# Patient Record
Sex: Male | Born: 1995 | Race: White | Hispanic: No | Marital: Single | Smoking: Current every day smoker
Health system: Southern US, Community
[De-identification: ages and names within clinical notes are randomized; demographics above are authoritative.]

## PROBLEM LIST (undated history)

## (undated) DIAGNOSIS — A749 Chlamydial infection, unspecified: Secondary | ICD-10-CM

## (undated) DIAGNOSIS — F909 Attention-deficit hyperactivity disorder, unspecified type: Secondary | ICD-10-CM

---
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
## 2014-03-29 ENCOUNTER — Encounter (HOSPITAL_COMMUNITY): Payer: Self-pay | Admitting: Emergency Medicine

## 2014-03-29 ENCOUNTER — Emergency Department (HOSPITAL_COMMUNITY)
Admission: EM | Admit: 2014-03-29 | Discharge: 2014-03-30 | Disposition: A | Discharge status: Home / Self Care | Payer: MEDICAID | Attending: Emergency Medicine | Admitting: Emergency Medicine

## 2014-03-29 DIAGNOSIS — F1410 Cocaine abuse, uncomplicated: Secondary | ICD-10-CM | POA: Diagnosis not present

## 2014-03-29 DIAGNOSIS — Z720 Tobacco use: Secondary | ICD-10-CM | POA: Diagnosis not present

## 2014-03-29 DIAGNOSIS — F329 Major depressive disorder, single episode, unspecified: Secondary | ICD-10-CM | POA: Insufficient documentation

## 2014-03-29 DIAGNOSIS — F1310 Sedative, hypnotic or anxiolytic abuse, uncomplicated: Secondary | ICD-10-CM | POA: Insufficient documentation

## 2014-03-29 DIAGNOSIS — Z046 Encounter for general psychiatric examination, requested by authority: Secondary | ICD-10-CM | POA: Diagnosis present

## 2014-03-29 DIAGNOSIS — F32A Depression, unspecified: Secondary | ICD-10-CM

## 2014-03-29 DIAGNOSIS — F1910 Other psychoactive substance abuse, uncomplicated: Secondary | ICD-10-CM | POA: Diagnosis not present

## 2014-03-29 DIAGNOSIS — Z639 Problem related to primary support group, unspecified: Secondary | ICD-10-CM | POA: Diagnosis not present

## 2014-03-29 LAB — CBC WITH DIFFERENTIAL/PLATELET
Basophils Absolute: 0.1 10*3/uL (ref 0.0–0.1)
Basophils Relative: 1 % (ref 0–1)
Eosinophils Absolute: 0.5 10*3/uL (ref 0.0–0.7)
Eosinophils Relative: 7 % — ABNORMAL HIGH (ref 0–5)
HCT: 47.8 % (ref 39.0–52.0)
Hemoglobin: 16.5 g/dL (ref 13.0–17.0)
Lymphocytes Relative: 19 % (ref 12–46)
Lymphs Abs: 1.2 10*3/uL (ref 0.7–4.0)
MCH: 31.7 pg (ref 26.0–34.0)
MCHC: 34.5 g/dL (ref 30.0–36.0)
MCV: 91.9 fL (ref 78.0–100.0)
Monocytes Absolute: 0.6 10*3/uL (ref 0.1–1.0)
Monocytes Relative: 9 % (ref 3–12)
Neutro Abs: 4.2 10*3/uL (ref 1.7–7.7)
Neutrophils Relative %: 64 % (ref 43–77)
Platelets: 183 10*3/uL (ref 150–400)
RBC: 5.2 MIL/uL (ref 4.22–5.81)
RDW: 12.9 % (ref 11.5–15.5)
WBC: 6.5 10*3/uL (ref 4.0–10.5)

## 2014-03-29 LAB — RAPID URINE DRUG SCREEN, HOSP PERFORMED
Amphetamines: NOT DETECTED
Barbiturates: POSITIVE — AB
Benzodiazepines: NOT DETECTED
Cocaine: POSITIVE — AB
Opiates: NOT DETECTED
Tetrahydrocannabinol: NOT DETECTED

## 2014-03-29 LAB — BASIC METABOLIC PANEL
Anion gap: 8 (ref 5–15)
BUN: 9 mg/dL (ref 6–23)
CO2: 26 mmol/L (ref 19–32)
Calcium: 8.9 mg/dL (ref 8.4–10.5)
Chloride: 102 mmol/L (ref 96–112)
Creatinine, Ser: 0.87 mg/dL (ref 0.50–1.35)
GFR calc Af Amer: >90 mL/min (ref >90)
GFR calc non Af Amer: >90 mL/min (ref >90)
Glucose, Bld: 109 mg/dL — ABNORMAL HIGH (ref 70–99)
Potassium: 3.8 mmol/L (ref 3.5–5.1)
Sodium: 136 mmol/L (ref 135–145)

## 2014-03-29 LAB — ETHANOL: Alcohol, Ethyl (B): <5 mg/dL (ref 0–9)
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
## 2014-03-29 NOTE — ED Notes (Signed)
Meal tray and drink provided at patient request along with warm blanket. Sitter at bedside. No other needs voiced.
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
## 2014-03-30 DIAGNOSIS — Z639 Problem related to primary support group, unspecified: Secondary | ICD-10-CM

## 2014-03-30 DIAGNOSIS — F1910 Other psychoactive substance abuse, uncomplicated: Secondary | ICD-10-CM

## 2014-03-30 DIAGNOSIS — F1410 Cocaine abuse, uncomplicated: Secondary | ICD-10-CM | POA: Insufficient documentation

## 2014-03-30 DIAGNOSIS — Z638 Other specified problems related to primary support group: Secondary | ICD-10-CM | POA: Insufficient documentation

## 2014-03-30 NOTE — ED Provider Notes (Signed)
He has been evaluated by TTS, who feel that he is stable for discharge and to follow-up with his usual psychiatric caregivers. The patient's involuntary commitment, has not been upheld.  On evaluation, patient admits to substance abuse, but denies suicidal ideation. He does not have any homicidal ideation. He is ready to go home and agrees to follow-up at [facility].  Andrew BaleElliott Emillie Chasen, MD 03/30/14 1434

## 2014-03-30 NOTE — ED Notes (Signed)
Officer here to transport pt home, took original ivc paperwork

## 2014-03-30 NOTE — ED Notes (Signed)
Pt denies SI/HI, states he only did cocaine one time and trying to get off all drugs, states he is doing better

## 2014-03-30 NOTE — ED Notes (Signed)
Reassess Telepsy complete

## 2014-03-30 NOTE — Discharge Instructions (Signed)
[facility] this [facility], as scheduled. Avoid using all illegal drugs.

## 2014-03-30 NOTE — ED Notes (Signed)
Patient given discharge instruction, verbalized understand. Patient ambulatory out of the department.  

## 2014-03-30 NOTE — ED Notes (Addendum)
Per Licensed conveyancerunit secretary. IVC paperwork rescinded and police called to pick up pt.nad noted.

## 2014-03-30 NOTE — Progress Notes (Signed)
CSW spoke with pt's grandfather via phone to gather collateral information at NP's request. Andrew LoronGrandfather states [facility] is [facility], he is not sure exactly what services he receives but understands it is SA related. States pt has no known hx of self-harm or suicide attempts, and he denies that patient has ever exhibited violent behavior directed toward individuals. He does state patient  "seems to have major anger problems" and will "punch walls and tear up furniture." States he is unsure of patient's hx of of current SA use.States he would like pt to be court ordered to attend outpatient therapy and psychiatrist appointments to address "his mood swings."   Reports he does not feel pt is in danger of harm to himself or others at this time and does not feel unsafe with patient in the home.  Andrew SkillMeghan Devarius Nelles, MSW, LCSWA Clinical Social Work, Disposition Office 03/30/2014

## 2014-03-30 NOTE — ED Notes (Signed)
resended paper work, pt Journalist, newspaperwant officer to take him home
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
## 2016-03-13 ENCOUNTER — Emergency Department (HOSPITAL_COMMUNITY): Payer: Medicaid Other

## 2016-03-13 ENCOUNTER — Inpatient Hospital Stay (HOSPITAL_COMMUNITY)
Admission: EM | Admit: 2016-03-13 | Discharge: 2016-03-15 | DRG: 896 | Disposition: A | Discharge status: Home / Self Care | Payer: Medicaid Other | Attending: Family Medicine | Admitting: Family Medicine

## 2016-03-13 ENCOUNTER — Encounter (HOSPITAL_COMMUNITY): Payer: Self-pay

## 2016-03-13 DIAGNOSIS — J9601 Acute respiratory failure with hypoxia: Secondary | ICD-10-CM | POA: Diagnosis present

## 2016-03-13 DIAGNOSIS — F10129 Alcohol abuse with intoxication, unspecified: Principal | ICD-10-CM | POA: Diagnosis present

## 2016-03-13 DIAGNOSIS — R4182 Altered mental status, unspecified: Secondary | ICD-10-CM | POA: Diagnosis present

## 2016-03-13 DIAGNOSIS — Y908 Blood alcohol level of 240 mg/100 ml or more: Secondary | ICD-10-CM | POA: Diagnosis present

## 2016-03-13 DIAGNOSIS — Z978 Presence of other specified devices: Secondary | ICD-10-CM

## 2016-03-13 DIAGNOSIS — T50904A Poisoning by unspecified drugs, medicaments and biological substances, undetermined, initial encounter: Secondary | ICD-10-CM

## 2016-03-13 DIAGNOSIS — R40243 Glasgow coma scale score 3-8, unspecified time: Secondary | ICD-10-CM

## 2016-03-13 DIAGNOSIS — R402432 Glasgow coma scale score 3-8, at arrival to emergency department: Secondary | ICD-10-CM

## 2016-03-13 DIAGNOSIS — F10929 Alcohol use, unspecified with intoxication, unspecified: Secondary | ICD-10-CM

## 2016-03-13 DIAGNOSIS — F17210 Nicotine dependence, cigarettes, uncomplicated: Secondary | ICD-10-CM | POA: Diagnosis present

## 2016-03-13 DIAGNOSIS — T50901A Poisoning by unspecified drugs, medicaments and biological substances, accidental (unintentional), initial encounter: Secondary | ICD-10-CM

## 2016-03-13 LAB — COMPREHENSIVE METABOLIC PANEL
ALT: 15 U/L — ABNORMAL LOW (ref 17–63)
AST: 22 U/L (ref 15–41)
Albumin: 4.3 g/dL (ref 3.5–5.0)
Alkaline Phosphatase: 78 U/L (ref 38–126)
Anion gap: 9 (ref 5–15)
BUN: 8 mg/dL (ref 6–20)
CO2: 27 mmol/L (ref 22–32)
Calcium: 8.7 mg/dL — ABNORMAL LOW (ref 8.9–10.3)
Chloride: 108 mmol/L (ref 101–111)
Creatinine, Ser: 0.88 mg/dL (ref 0.61–1.24)
GFR calc Af Amer: >60 mL/min (ref >60)
GFR calc non Af Amer: >60 mL/min (ref >60)
Glucose, Bld: 104 mg/dL — ABNORMAL HIGH (ref 65–99)
Potassium: 4 mmol/L (ref 3.5–5.1)
Sodium: 144 mmol/L (ref 135–145)
Total Bilirubin: 0.4 mg/dL (ref 0.3–1.2)
Total Protein: 7.1 g/dL (ref 6.5–8.1)

## 2016-03-13 LAB — CBC WITH DIFFERENTIAL/PLATELET
Basophils Absolute: 0.1 10*3/uL (ref 0.0–0.1)
Basophils Relative: 1 %
Eosinophils Absolute: 0.2 10*3/uL (ref 0.0–0.7)
Eosinophils Relative: 2 %
HCT: 45.4 % (ref 39.0–52.0)
Hemoglobin: 16 g/dL (ref 13.0–17.0)
Lymphocytes Relative: 24 %
Lymphs Abs: 2.8 10*3/uL (ref 0.7–4.0)
MCH: 32.7 pg (ref 26.0–34.0)
MCHC: 35.2 g/dL (ref 30.0–36.0)
MCV: 92.8 fL (ref 78.0–100.0)
Monocytes Absolute: 1.1 10*3/uL — ABNORMAL HIGH (ref 0.1–1.0)
Monocytes Relative: 9 %
Neutro Abs: 7.2 10*3/uL (ref 1.7–7.7)
Neutrophils Relative %: 64 %
Platelets: 242 10*3/uL (ref 150–400)
RBC: 4.89 MIL/uL (ref 4.22–5.81)
RDW: 12.7 % (ref 11.5–15.5)
WBC: 11.3 10*3/uL — ABNORMAL HIGH (ref 4.0–10.5)

## 2016-03-13 LAB — BLOOD GAS, ARTERIAL
Acid-Base Excess: 0.9 mmol/L (ref 0.0–2.0)
Bicarbonate: 23.3 mmol/L (ref 20.0–28.0)
Drawn by: 317771
FIO2: 0.4
MECHVT: 520 mL
O2 Content: 40 L/min
O2 Saturation: 98.9 %
PEEP: 5 cmH2O
RATE: 15 resp/min
pCO2 arterial: 44.8 mmHg (ref 32.0–48.0)
pH, Arterial: 7.349 — ABNORMAL LOW (ref 7.350–7.450)
pO2, Arterial: 203 mmHg — ABNORMAL HIGH (ref 83.0–108.0)

## 2016-03-13 LAB — RAPID URINE DRUG SCREEN, HOSP PERFORMED
Amphetamines: NOT DETECTED
Barbiturates: NOT DETECTED
Benzodiazepines: NOT DETECTED
Cocaine: NOT DETECTED
Opiates: NOT DETECTED
Tetrahydrocannabinol: NOT DETECTED

## 2016-03-13 LAB — URINALYSIS, ROUTINE W REFLEX MICROSCOPIC
Bilirubin Urine: NEGATIVE
Glucose, UA: NEGATIVE mg/dL
Hgb urine dipstick: NEGATIVE
Ketones, ur: NEGATIVE mg/dL
Leukocytes, UA: NEGATIVE
Nitrite: NEGATIVE
Protein, ur: NEGATIVE mg/dL
Specific Gravity, Urine: 1.001 — ABNORMAL LOW (ref 1.005–1.030)
pH: 5 (ref 5.0–8.0)

## 2016-03-13 LAB — ETHANOL: Alcohol, Ethyl (B): 392 mg/dL (ref <5)

## 2016-03-13 MED ORDER — PROPOFOL 1000 MG/100ML IV EMUL
5.0000 ug/kg/min | Freq: Once | INTRAVENOUS | Status: AC
  Administered 2016-03-13: 5 ug/kg/min via INTRAVENOUS

## 2016-03-13 MED ORDER — NALOXONE HCL 2 MG/2ML IJ SOSY
PREFILLED_SYRINGE | INTRAMUSCULAR | Status: AC
  Filled 2016-03-13: qty 2

## 2016-03-13 MED ORDER — PROPOFOL 1000 MG/100ML IV EMUL
INTRAVENOUS | Status: AC
  Filled 2016-03-13: qty 100

## 2016-03-13 MED ORDER — NALOXONE HCL 2 MG/2ML IJ SOSY
2.0000 mg | PREFILLED_SYRINGE | Freq: Once | INTRAMUSCULAR | Status: AC
  Administered 2016-03-13: 2 mg via INTRAVENOUS

## 2016-03-13 MED ORDER — ONDANSETRON HCL 4 MG/2ML IJ SOLN
4.0000 mg | Freq: Once | INTRAMUSCULAR | Status: AC
  Administered 2016-03-13: 4 mg via INTRAVENOUS
  Filled 2016-03-13: qty 2

## 2016-03-13 MED ORDER — ROCURONIUM BROMIDE 50 MG/5ML IV SOLN
50.0000 mg | Freq: Once | INTRAVENOUS | Status: AC
  Administered 2016-03-13: 50 mg via INTRAVENOUS

## 2016-03-13 NOTE — ED Notes (Signed)
Emergency Contact: AuntColvin Caroli: Charity Cooper [phone]

## 2016-03-13 NOTE — ED Provider Notes (Signed)
[facility]-EMERGENCY DEPT Provider Note   CSN: [id number] Arrival date & time: 03/13/16  2130 By signing my name below, I, Andrew Watkins, attest that this documentation has been prepared under the direction and in the presence of Eber Hong, MD . Electronically Signed: Levon Watkins, Scribe. 03/13/2016. 9:37 PM.   History   Chief Complaint Chief Complaint  Patient presents with  . Drug Overdose   LEVEL 5 CAVEAT-- PT UNRESPONSIVE   HPI REFORD Andrew Watkins is a 21 y.o. male brought in by ambulance who presents to the Emergency Department with a chief complaint of drug overdose. Pt was dropped off in a coma at a friend's house by other friends who were drinking with him. No other history available other than a family member who stated he has a hx of abusing opiates and other drugs. Paramedics gave 4 mg of narcan with no improvement. CBG was 110. Vital signs with slowed respirations, low 90's oxygen, assisted respirations en route.  The history is provided by the EMS personnel. The history is limited by the condition of the patient. No language interpreter was used.   Past Medical History:  Diagnosis Date  . ADHD (attention deficit hyperactivity disorder)     Patient Active Problem List   Diagnosis Date Noted  . Family conflict   . Cocaine abuse     History reviewed. No pertinent surgical history.   Home Medications    Prior to Admission medications   Medication Sig Start Date End Date Taking? Authorizing Provider  diphenhydrAMINE (BENADRYL) 25 mg capsule Take 25 mg by mouth every 6 (six) hours as needed for allergies.    Historical Provider, MD    Family History No family history on file.  Social History Social History  Substance Use Topics  . Smoking status: Current Every Day Smoker    Types: Cigarettes  . Smokeless tobacco: Never Used  . Alcohol use Yes     Comment: occasional     Allergies   Patient has no known allergies.   Review of Systems Review of Systems    Unable to perform ROS: Patient unresponsive   Physical Exam Updated Vital Signs BP 111/81   Pulse 99   Resp 14   Ht 5\' 10"  (1.778 m)   Wt 130 lb (59 kg)   SpO2 100%   BMI 18.65 kg/m   Physical Exam  Constitutional: He appears well-developed and well-nourished. He appears distressed.  HENT:  Head: Normocephalic and atraumatic.  Mouth/Throat: Oropharynx is clear and moist. No oropharyngeal exudate.  Multiple distasteful drawings on the patient's face including words and pictures of male genitalia  Eyes: Pupils are equal, round, and reactive to light. Right eye exhibits no discharge. Left eye exhibits no discharge. No scleral icterus.  Pupils are enlarged, symmetrical and reactive, mild conjunctival injection bilaterally, no drainage or discharge, no swelling of the lids  Neck: Normal range of motion. Neck supple. No JVD present. No thyromegaly present.  Cardiovascular: Regular rhythm, normal heart sounds and intact distal pulses.  Exam reveals no gallop and no friction rub.   No murmur heard. Mild tachycardia, strong pulses  Pulmonary/Chest: Breath sounds normal. He is in respiratory distress. He has no wheezes. He has no rales.  Slowed respirations, inadequate respiratory effort, decreased lung sounds bilaterally  Abdominal: Soft. Bowel sounds are normal. He exhibits no distension and no mass. There is no tenderness.  Musculoskeletal: Normal range of motion. He exhibits no edema or tenderness.  Lymphadenopathy:    He has no  cervical adenopathy.  Neurological:  The patient is obtunded, he does not respond to anything but deep painful stimuli which gives him a grimace but he does not move arms or legs during this time  Skin: Skin is warm and dry. No rash noted. No erythema.  No signs of injury to the skin with lacerations abrasions hematomas or contusions. There is multiple black marker drawings allover the patient's skin on his arms and his face  Psychiatric: He has a normal mood  and affect. His behavior is normal.  Nursing note and vitals reviewed.  ED Treatments / Results  DIAGNOSTIC STUDIES:  Oxygen Saturation is 90% on RA, normal by my interpretation.     Labs (all labs ordered are listed, but only abnormal results are displayed) Labs Reviewed  ETHANOL - Abnormal; Notable for the following:       Result Value   Alcohol, Ethyl (B) 392 (*)    All other components within normal limits  COMPREHENSIVE METABOLIC PANEL - Abnormal; Notable for the following:    Glucose, Bld 104 (*)    Calcium 8.7 (*)    ALT 15 (*)    All other components within normal limits  CBC WITH DIFFERENTIAL/PLATELET - Abnormal; Notable for the following:    WBC 11.3 (*)    Monocytes Absolute 1.1 (*)    All other components within normal limits  URINALYSIS, ROUTINE W REFLEX MICROSCOPIC - Abnormal; Notable for the following:    Color, Urine COLORLESS (*)    Specific Gravity, Urine 1.001 (*)    All other components within normal limits  RAPID URINE DRUG SCREEN, HOSP PERFORMED  BLOOD GAS, ARTERIAL    EKG  EKG Interpretation  Date/Time:  Friday March 13 2016 21:50:30 EST Ventricular Rate:  72 PR Interval:    QRS Duration: 113 QT Interval:  386 QTC Calculation: 423 R Axis:   89 Text Interpretation:  Sinus rhythm Inferior infarct, acute (LCx) ST elevation, consider anterior injury Lateral leads are also involved diffuse ST elevattions - likely early repolarization Confirmed by Jalila Goodnough  MD, Orvil Faraone ([id number]) on 03/13/2016 10:16:22 PM       Radiology Dg Chest Port 1 View  Result Date: 03/13/2016 CLINICAL DATA:  Status post intubation for drug overdose. Unresponsive. EXAM: PORTABLE CHEST 1 VIEW COMPARISON:  None. FINDINGS: The heart size and mediastinal contours are within normal limits. Tip of endotracheal tube is 3.9 cm above the carina in satisfactory position. Gastric tube is seen coiled in the expected location of the stomach. Both lungs are clear. The visualized skeletal structures  are unremarkable. IMPRESSION: Satisfactory support line and tube positions.  Clear lungs. Electronically Signed   By: Tollie Eth M.D.   On: 03/13/2016 22:10    Procedures OG placement Date/Time: 03/13/2016 9:49 PM Performed by: Eber Hong Authorized by: Eber Hong  Consent: The procedure was performed in an emergent situation. Required items: required blood products, implants, devices, and special equipment available Patient identity confirmed: arm band Time out: Immediately prior to procedure a "time out" was called to verify the correct patient, procedure, equipment, support staff and site/side marked as required. Preparation: Patient was prepped and draped in the usual sterile fashion. Local anesthesia used: no  Anesthesia: Local anesthesia used: no Patient tolerance: Patient tolerated the procedure well with no immediate complications Comments: OG placed - imaging ordered to confirm placement    (including critical care time)  Medications Ordered in ED Medications  naloxone [facility]) injection 2 mg (2 mg Intravenous Given 03/13/16 2130)  rocuronium [facility](ZEMURON) injection 50 mg (50 mg Intravenous Given 03/13/16 2133)  ondansetron [facility](ZOFRAN) injection 4 mg (4 mg Intravenous Given 03/13/16 2205)  propofol (DIPRIVAN) 1000 MG/100ML infusion (20 mcg/kg/min  59 kg Intravenous Transfusing/Transfer 03/13/16 2256)     Initial Impression / Assessment and Plan / ED Course  I have reviewed the triage vital signs and the nursing notes.  Pertinent labs & imaging results that were available during my care of the patient were reviewed by me and considered in my medical decision making (see chart for details).  INTUBATION Performed by: Vida RollerBrian D Asyria Kolander  Required items: required blood products, implants, devices, and special equipment available Patient identity confirmed: provided demographic data and hospital-assigned identification number Time out: Immediately prior to procedure a "time out" was  called to verify the correct patient, procedure, equipment, support staff and site/side marked as required.  Indications: GCS 4, decreased respiratory effor  Intubation method: Direct Laryngoscopy   Preoxygenation: BVM  Sedatives: No sedation needed - obtunded Paralytic: 50mg  Rocuronium  Tube Size: 8--0 cuffed  Post-procedure assessment: chest rise and ETCO2 monitor Breath sounds: equal and absent over the epigastrium Tube secured with: ETT holder Chest x-ray interpreted by radiologist and me.  Chest x-ray findings: endotracheal tube in appropriate position  Patient tolerated the procedure well with no immediate complications.  the patient is clearly critically ill, he does not have the capacity to make decisions about intubation as he is unable to even open his eyes, he does not speak, his GCS is extremely low and he is barely breathing enough to maintain her oxygen level that is adequate for organ perfusion. At this time the decision was made to both intubated the patient as well as placed an orogastric tube. We will evaluate for the source of the severe obtunded state, he is essentially in a coma. He is critically ill. He will be placed on a ventilator. Alcohol, drug, labs ordered, chest x-ray to confirm placement of tubes. Propofol for sedation.  CRITICAL CARE Performed by: Vida RollerBrian D Lealon Vanputten Total critical care time: 35 minutes Critical care time was exclusive of separately billable procedures and treating other patients. Critical care was necessary to treat or prevent imminent or life-threatening deterioration. Critical care was time spent personally by me on the following activities: development of treatment plan with patient and/or surrogate as well as nursing, discussions with consultants, evaluation of patient's response to treatment, examination of patient, obtaining history from patient or surrogate, ordering and performing treatments and interventions, ordering and review of  laboratory studies, ordering and review of radiographic studies, pulse oximetry and re-evaluation of patient's condition.   D/W Dr. Sharl MaLama - will admit to ICU  Final Clinical Impressions(s) / ED Diagnoses   Final diagnoses:  Glasgow coma scale total score 3-8, at arrival to emergency department [facility](HCC)  Drug overdose, undetermined intent, initial encounter  Alcoholic intoxication with complication [facility](HCC)    New Prescriptions New Prescriptions   No medications on file   I personally performed the services described in this documentation, which was scribed in my presence. The recorded information has been reviewed and is accurate.        Eber HongBrian Bayli Quesinberry, MD 03/13/16 [phone]

## 2016-03-13 NOTE — ED Triage Notes (Signed)
Pt was dropped off at a friends house unresponsive, ems arrived and pt was given 4 mg narcan without response.  Unknown what pt may have taken, but friends state pt has been drinking.

## 2016-03-13 NOTE — ED Notes (Signed)
CRITICAL VALUE ALERT  Critical value received:  etoh 392  Date of notification:  03/13/16  Time of notification:  2230  Critical value read back: yes  Nurse who received alert:  Wynona Dovehris Corsica Franson rn  MD notified (1st page):  miller  Time of first page:  2230  MD notified (2nd page):  Time of second page:  Responding MD:  [name]  Time MD responded:  2230

## 2016-03-13 NOTE — H&P (Signed)
[facility] H&P    Patient Demographics:    Andrew Watkins, is a 21 y.o. male  MRN: [id number]  DOB - 08/01/1995  Admit Date - 03/13/2016  Referring MD/NP/PA: Dr. Hyacinth Meeker  Outpatient Primary MD for the patient is No PCP Per Patient   Chief Complaint  Patient presents with  . Drug Overdose      HPI:    Andrew Watkins  is a 21 y.o. male, Was brought to the ED by ambulance with complaints of drug overdose. Patient was dropped off in a coma at a friend's house by other friends were drinking with him. Patient instantly got a 1 day pass to get out of rehabilitation, where he has been admitted for detox. Incidentally patient and his friends were drinking and then the drop them off in a coma at the friend's house. EMS was called patient was given 4 mg Narcan with no improvement. Has patient had very shallow respirations O2 sats in low 90s, he was intubated in the ED.  No other history is obtainable. Alcohol level was 392    Review of systems:    Unobtainable as patient is intubated   With Past History of the following :    Past Medical History:  Diagnosis Date  . ADHD (attention deficit hyperactivity disorder)       History reviewed. No pertinent surgical history.    Social History:      Social History  Substance Use Topics  . Smoking status: Current Every Day Smoker    Types: Cigarettes  . Smokeless tobacco: Never Used  . Alcohol use Yes     Comment: occasional       Family History :   Family history is unobtainable as patient is intubated   Home Medications:   Prior to Admission medications   Medication Sig Start Date End Date Taking? Authorizing Provider  diphenhydrAMINE (BENADRYL) 25 mg capsule Take 25 mg by mouth every 6 (six) hours as needed for allergies.    Historical Provider, MD     Allergies:    No Known Allergies   Physical Exam:   Vitals  Blood pressure 111/81, pulse 99,  resp. rate 14, height 5\' 10"  (1.778 m), weight 59 kg (130 lb), SpO2 100 %.  1.  General: Intubated  2. Psychiatric: Not tested  3. Neurologic: Patient is currently on Diprivan, neurological examination not performed  4. Eyes :  anicteric sclerae, moist conjunctivae   5. ENMT:  Endotracheal tube in place  6. Neck:  supple, no cervical lymphadenopathy appriciated, No thyromegaly  7. Respiratory : Normal respiratory effort, good air movement bilaterally,clear to  auscultation bilaterally  8. Cardiovascular : RRR, no gallops, rubs or murmurs, no leg edema  9. Gastrointestinal:  Positive bowel sounds, abdomen soft, non-tender to palpation,no hepatosplenomegaly, no rigidity or guarding       10. Skin:  No cyanosis, normal texture and turgor, no rash, lesions or ulcers     Data Review:    CBC  Recent Labs Lab 03/13/16 2143  WBC 11.3*  HGB 16.0  HCT 45.4  PLT 242  MCV 92.8  MCH 32.7  MCHC 35.2  RDW 12.7  LYMPHSABS 2.8  MONOABS 1.1*  EOSABS 0.2  BASOSABS 0.1   ------------------------------------------------------------------------------------------------------------------  Chemistries   Recent Labs Lab 03/13/16 2143  NA 144  K 4.0  CL 108  CO2 27  GLUCOSE 104*  BUN 8  CREATININE 0.88  CALCIUM 8.7*  AST 22  ALT 15*  ALKPHOS 78  BILITOT 0.4   ------------------------------------------------------------------------------------------------------------------  ------------------------------------------------------------------------------------------------------------------ GFR: Estimated Creatinine Clearance: 110.8 mL/min (by C-G formula based on SCr of 0.88 mg/dL). Liver Function Tests:  Recent Labs Lab 03/13/16 2143  AST 22  ALT 15*  ALKPHOS 78  BILITOT 0.4  PROT 7.1  ALBUMIN 4.3    --------------------------------------------------------------------------------------------------------------- Urine analysis:    Component Value  Date/Time   COLORURINE COLORLESS (A) 03/13/2016 2143   APPEARANCEUR CLEAR 03/13/2016 2143   LABSPEC 1.001 (L) 03/13/2016 2143   PHURINE 5.0 03/13/2016 2143   GLUCOSEU NEGATIVE 03/13/2016 2143   HGBUR NEGATIVE 03/13/2016 2143   BILIRUBINUR NEGATIVE 03/13/2016 2143   KETONESUR NEGATIVE 03/13/2016 2143   PROTEINUR NEGATIVE 03/13/2016 2143   UROBILINOGEN 0.2 03/09/2013 2312   NITRITE NEGATIVE 03/13/2016 2143   LEUKOCYTESUR NEGATIVE 03/13/2016 2143      Imaging Results:    Dg Chest Port 1 View  Result Date: 03/13/2016 CLINICAL DATA:  Status post intubation for drug overdose. Unresponsive. EXAM: PORTABLE CHEST 1 VIEW COMPARISON:  None. FINDINGS: The heart size and mediastinal contours are within normal limits. Tip of endotracheal tube is 3.9 cm above the carina in satisfactory position. Gastric tube is seen coiled in the expected location of the stomach. Both lungs are clear. The visualized skeletal structures are unremarkable. IMPRESSION: Satisfactory support line and tube positions.  Clear lungs. Electronically Signed   By: Tollie Ethavid  Kwon M.D.   On: 03/13/2016 22:10    My personal review of EKG: Diffuse ST elevation likely early repolarization   Assessment & Plan:    Active Problems:   Altered mental status   1. Acute hypoxic respiratory failure- secondary to alcohol intoxication, urine drug screen was negative for opiates, cocaine, THC, amphetamines. Currently intubated, continue Diprivan for sedation. Chest x-ray shows no acute abnormality. 2. Altered mental status- likely from alcohol intoxication, UDS negative. 3. Alcohol intoxication- will start thiamine, folate, IV normal saline at 100 ML per hour. Patient is currently on Diprivan. Once he is extubated, may need to start Ativan, CIWA protocol.   DVT Prophylaxis-   Lovenox   AM Labs Ordered, also please review Full Orders  Family Communication: No family present at bedside  Code Status:  Full code  Admission status:  Observation    Time spent in minutes : 60 minutes   Amadi Frady S M.D on 03/13/2016 at 11:36 PM  Between 7am to 7pm - Pager - [phone]. After 7pm go to [email or web address] - password [email or web address]  [facility] - Office  [phone]

## 2016-03-14 ENCOUNTER — Encounter (HOSPITAL_COMMUNITY): Payer: Self-pay

## 2016-03-14 DIAGNOSIS — T50904A Poisoning by unspecified drugs, medicaments and biological substances, undetermined, initial encounter: Secondary | ICD-10-CM

## 2016-03-14 DIAGNOSIS — R40243 Glasgow coma scale score 3-8, unspecified time: Secondary | ICD-10-CM

## 2016-03-14 DIAGNOSIS — R402432 Glasgow coma scale score 3-8, at arrival to emergency department: Secondary | ICD-10-CM

## 2016-03-14 DIAGNOSIS — F10929 Alcohol use, unspecified with intoxication, unspecified: Secondary | ICD-10-CM

## 2016-03-14 DIAGNOSIS — Z978 Presence of other specified devices: Secondary | ICD-10-CM

## 2016-03-14 DIAGNOSIS — T50901A Poisoning by unspecified drugs, medicaments and biological substances, accidental (unintentional), initial encounter: Secondary | ICD-10-CM

## 2016-03-14 LAB — COMPREHENSIVE METABOLIC PANEL
ALT: 15 U/L — ABNORMAL LOW (ref 17–63)
AST: 22 U/L (ref 15–41)
Albumin: 4 g/dL (ref 3.5–5.0)
Alkaline Phosphatase: 68 U/L (ref 38–126)
Anion gap: 10 (ref 5–15)
BUN: 8 mg/dL (ref 6–20)
CO2: 23 mmol/L (ref 22–32)
Calcium: 8.4 mg/dL — ABNORMAL LOW (ref 8.9–10.3)
Chloride: 111 mmol/L (ref 101–111)
Creatinine, Ser: 0.73 mg/dL (ref 0.61–1.24)
GFR calc Af Amer: >60 mL/min (ref >60)
GFR calc non Af Amer: >60 mL/min (ref >60)
Glucose, Bld: 101 mg/dL — ABNORMAL HIGH (ref 65–99)
Potassium: 3.6 mmol/L (ref 3.5–5.1)
Sodium: 144 mmol/L (ref 135–145)
Total Bilirubin: 0.4 mg/dL (ref 0.3–1.2)
Total Protein: 6.5 g/dL (ref 6.5–8.1)

## 2016-03-14 LAB — CBC
HCT: 43.7 % (ref 39.0–52.0)
Hemoglobin: 15.2 g/dL (ref 13.0–17.0)
MCH: 32 pg (ref 26.0–34.0)
MCHC: 34.8 g/dL (ref 30.0–36.0)
MCV: 92 fL (ref 78.0–100.0)
Platelets: 257 10*3/uL (ref 150–400)
RBC: 4.75 MIL/uL (ref 4.22–5.81)
RDW: 13 % (ref 11.5–15.5)
WBC: 7.6 10*3/uL (ref 4.0–10.5)

## 2016-03-14 LAB — BLOOD GAS, ARTERIAL
Acid-base deficit: 4.4 mmol/L — ABNORMAL HIGH (ref 0.0–2.0)
Bicarbonate: 21 mmol/L (ref 20.0–28.0)
Drawn by: 234301
FIO2: 30
MECHVT: POSITIVE mL
O2 Content: 97.8 L/min
O2 Saturation: 97.8 %
PEEP: 5 cmH2O
Patient temperature: 37
Pressure support: 5 cmH2O
pCO2 arterial: 36.8 mmHg (ref 32.0–48.0)
pH, Arterial: 7.358 (ref 7.350–7.450)
pO2, Arterial: 122 mmHg — ABNORMAL HIGH (ref 83.0–108.0)

## 2016-03-14 LAB — MRSA PCR SCREENING: MRSA by PCR: NEGATIVE

## 2016-03-14 LAB — TRIGLYCERIDES: Triglycerides: 61 mg/dL (ref <150)

## 2016-03-14 MED ORDER — ACETAMINOPHEN 650 MG RE SUPP: 650.0000 mg | Freq: Four times a day (QID) | RECTAL | Status: DC | PRN

## 2016-03-14 MED ORDER — THIAMINE HCL 100 MG/ML IJ SOLN
Freq: Once | INTRAVENOUS | Status: AC
  Administered 2016-03-14: 03:00:00 via INTRAVENOUS
  Filled 2016-03-14: qty 1000

## 2016-03-14 MED ORDER — THIAMINE HCL 100 MG/ML IJ SOLN
Freq: Once | INTRAVENOUS | Status: AC
  Administered 2016-03-14: 02:00:00 via INTRAVENOUS
  Filled 2016-03-14: qty 1000

## 2016-03-14 MED ORDER — M.V.I. ADULT IV INJ
INJECTION | INTRAVENOUS | Status: AC
  Filled 2016-03-14: qty 10

## 2016-03-14 MED ORDER — SODIUM CHLORIDE 0.9 % IV SOLN
INTRAVENOUS | Status: DC
  Administered 2016-03-14: 21:00:00 via INTRAVENOUS
  Administered 2016-03-15: 1000 mL via INTRAVENOUS

## 2016-03-14 MED ORDER — FAMOTIDINE IN NACL 20-0.9 MG/50ML-% IV SOLN
20.0000 mg | INTRAVENOUS | Status: DC
  Administered 2016-03-14 – 2016-03-15 (×2): 20 mg via INTRAVENOUS
  Filled 2016-03-14 (×2): qty 50

## 2016-03-14 MED ORDER — CHLORHEXIDINE GLUCONATE 0.12% ORAL RINSE (MEDLINE KIT)
15.0000 mL | Freq: Two times a day (BID) | OROMUCOSAL | Status: DC
  Administered 2016-03-14 (×4): 15 mL via OROMUCOSAL

## 2016-03-14 MED ORDER — THIAMINE HCL 100 MG/ML IJ SOLN
INTRAMUSCULAR | Status: AC
  Filled 2016-03-14: qty 2

## 2016-03-14 MED ORDER — FOLIC ACID 5 MG/ML IJ SOLN
INTRAMUSCULAR | Status: AC
  Filled 2016-03-14: qty 0.2

## 2016-03-14 MED ORDER — ORAL CARE MOUTH RINSE
15.0000 mL | Freq: Four times a day (QID) | OROMUCOSAL | Status: DC
  Administered 2016-03-14 – 2016-03-15 (×4): 15 mL via OROMUCOSAL

## 2016-03-14 MED ORDER — PROPOFOL 1000 MG/100ML IV EMUL
5.0000 ug/kg/min | INTRAVENOUS | Status: DC
  Administered 2016-03-14: 40 ug/kg/min via INTRAVENOUS
  Administered 2016-03-14 (×4): 30 ug/kg/min via INTRAVENOUS
  Filled 2016-03-14 (×2): qty 100

## 2016-03-14 MED ORDER — FENTANYL CITRATE (PF) 100 MCG/2ML IJ SOLN
50.0000 ug | Freq: Once | INTRAMUSCULAR | Status: AC
  Administered 2016-03-14: 50 ug via INTRAVENOUS
  Filled 2016-03-14: qty 2

## 2016-03-14 MED ORDER — ACETAMINOPHEN 325 MG PO TABS
650.0000 mg | ORAL_TABLET | Freq: Four times a day (QID) | ORAL | Status: DC | PRN
  Administered 2016-03-15: 650 mg via ORAL
  Filled 2016-03-14: qty 2

## 2016-03-14 MED ORDER — ENOXAPARIN SODIUM 40 MG/0.4ML ~~LOC~~ SOLN
40.0000 mg | SUBCUTANEOUS | Status: DC
  Administered 2016-03-14 – 2016-03-15 (×2): 40 mg via SUBCUTANEOUS
  Filled 2016-03-14 (×3): qty 0.4

## 2016-03-14 NOTE — Progress Notes (Signed)
Pt tolerated extubation well, O2 saturation 96 on 2L nasal cannula; however, pulse remains elevated(130s) . Pt has strong cough with copious secretions. Lung sounds clear but diminished. Plan is to start ice chips and advance as tolerated starting at 1800.

## 2016-03-14 NOTE — Plan of Care (Signed)
Problem: Education: Goal: Knowledge of [facility] Education information/materials will improve Outcome: Not Progressing PT SEDATED AND ON VENTILATOR  Problem: Safety: Goal: Ability to remain free from injury will improve Outcome: Not Progressing PT SEDATED AND ON VENTILATOR   Problem: Skin Integrity: Goal: Risk for impaired skin integrity will decrease Outcome: Progressing PT TURNED AND REPOSITIONED Q2HR   Problem: Nutrition: Goal: Adequate nutrition will be maintained Outcome: Not Progressing PT IS NPO AT PRESENT AND HAS OG CONNECTED TO LIS  Problem: Bowel/Gastric: Goal: Will not experience complications related to bowel motility Outcome: Progressing PT HAS ACTIVE BOWEL SOUNDS   Problem: Coping: Goal: Level of anxiety will decrease Outcome: Progressing PT IS TOLERATING BEING ON VENTILATOR WELL W/ PROPOFOL AT  30MCG/KG/MIN

## 2016-03-14 NOTE — Procedures (Signed)
Extubation Procedure Note  Patient Details:   Name: Levert FeinsteinWilliam M Stinnette DOB: 1995/07/03 MRN: [id number]   Airway Documentation:  Airway 7.5 mm (Active)  Secured at (cm) 25 cm 03/14/2016  3:25 PM  Measured From Lips 03/14/2016  3:25 PM  Secured Location Center 03/14/2016  3:25 PM  Secured By [employer] Tube Holder 03/14/2016  3:25 PM  Tube Holder Repositioned Yes 03/14/2016 12:08 PM  Cuff Pressure (cm H2O) 25 cm H2O 03/14/2016  2:30 AM  Site Condition Dry 03/14/2016 12:08 PM    Evaluation  O2 sats: stable throughout Complications: No apparent complications Patient did tolerate procedure well. Bilateral Breath Sounds: Clear, Diminished   Yes   RT performed weaning parameters, patient did a -25 NIF and 2.3L on vital capacity. RT extubated and placed patient on 2L O2. Patient's RR 22, SATs 97% on 2L O2 and HR 130-140, RN at bedside. RT will continue to monitor.  Cloretta NedBroadnax, Geniya Fulgham Ann 03/14/2016, 5:20 PM

## 2016-03-14 NOTE — Progress Notes (Signed)
PT'S FATHER FROM [place] CALLED TO CHECK ON PT'S CONDITION. HIS PHONE # IS [phone].
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
## 2016-03-15 ENCOUNTER — Encounter (HOSPITAL_COMMUNITY): Payer: Self-pay

## 2016-03-15 LAB — HIV ANTIBODY (ROUTINE TESTING W REFLEX): HIV Screen 4th Generation wRfx: NONREACTIVE
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
## 2016-03-15 NOTE — Progress Notes (Signed)
Being discharged to home. All IV access removed and covered with cotton ball. Clothing wet so gave set of paper disposable pants and shirt to wear home. Did get up and walk around room without difficulty. Discharge paperwork given to patient with no new prescriptions.

## 2017-12-14 ENCOUNTER — Emergency Department (HOSPITAL_COMMUNITY)
Admission: EM | Admit: 2017-12-14 | Discharge: 2017-12-15 | Disposition: A | Discharge status: Home / Self Care | Payer: Self-pay | Attending: Emergency Medicine | Admitting: Emergency Medicine

## 2017-12-14 ENCOUNTER — Encounter (HOSPITAL_COMMUNITY): Payer: Self-pay | Admitting: Emergency Medicine

## 2017-12-14 ENCOUNTER — Other Ambulatory Visit: Payer: Self-pay

## 2017-12-14 DIAGNOSIS — F1110 Opioid abuse, uncomplicated: Secondary | ICD-10-CM | POA: Insufficient documentation

## 2017-12-14 DIAGNOSIS — F17210 Nicotine dependence, cigarettes, uncomplicated: Secondary | ICD-10-CM | POA: Insufficient documentation

## 2017-12-14 DIAGNOSIS — Z638 Other specified problems related to primary support group: Secondary | ICD-10-CM

## 2017-12-14 DIAGNOSIS — R4182 Altered mental status, unspecified: Secondary | ICD-10-CM | POA: Insufficient documentation

## 2017-12-14 DIAGNOSIS — F1210 Cannabis abuse, uncomplicated: Secondary | ICD-10-CM | POA: Insufficient documentation

## 2017-12-14 DIAGNOSIS — F1910 Other psychoactive substance abuse, uncomplicated: Secondary | ICD-10-CM

## 2017-12-14 DIAGNOSIS — F919 Conduct disorder, unspecified: Secondary | ICD-10-CM | POA: Insufficient documentation

## 2017-12-14 LAB — RAPID URINE DRUG SCREEN, HOSP PERFORMED
Amphetamines: NOT DETECTED
Barbiturates: NOT DETECTED
Benzodiazepines: NOT DETECTED
Cocaine: NOT DETECTED
Opiates: POSITIVE — AB
Tetrahydrocannabinol: POSITIVE — AB
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
## 2017-12-14 NOTE — ED Notes (Signed)
ED Provider at bedside. 
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
## 2017-12-15 LAB — ETHANOL: Alcohol, Ethyl (B): <10 mg/dL (ref <10)

## 2017-12-15 LAB — SALICYLATE LEVEL: Salicylate Lvl: <7 mg/dL (ref 2.8–30.0)

## 2017-12-15 LAB — ACETAMINOPHEN LEVEL: Acetaminophen (Tylenol), Serum: <10 ug/mL — ABNORMAL LOW (ref 10–30)

## 2017-12-15 NOTE — Discharge Instructions (Signed)
You and your father may benefit from counseling services to help you with your conflicts at home.  See the suggested resources below.

## 2018-01-14 ENCOUNTER — Other Ambulatory Visit: Payer: Self-pay

## 2018-01-14 ENCOUNTER — Encounter (HOSPITAL_COMMUNITY): Payer: Self-pay | Admitting: Emergency Medicine

## 2018-01-14 ENCOUNTER — Emergency Department (HOSPITAL_COMMUNITY)
Admission: EM | Admit: 2018-01-14 | Discharge: 2018-01-15 | Disposition: A | Discharge status: Home / Self Care | Payer: Self-pay | Attending: Emergency Medicine | Admitting: Emergency Medicine

## 2018-01-14 DIAGNOSIS — F17210 Nicotine dependence, cigarettes, uncomplicated: Secondary | ICD-10-CM | POA: Insufficient documentation

## 2018-01-14 DIAGNOSIS — Z202 Contact with and (suspected) exposure to infections with a predominantly sexual mode of transmission: Secondary | ICD-10-CM | POA: Insufficient documentation

## 2018-01-14 DIAGNOSIS — Z711 Person with feared health complaint in whom no diagnosis is made: Secondary | ICD-10-CM

## 2018-01-14 HISTORY — DX: Chlamydial infection, unspecified: A74.9

## 2018-01-14 NOTE — ED Provider Notes (Signed)
[facility] EMERGENCY DEPARTMENT Provider Note   CSN: [id number] Arrival date & time: 01/14/18  2213     History   Chief Complaint Chief Complaint  Patient presents with  . Exposure to STD    HPI Andrew Watkins is a 23 y.o. male who presents with exposure to STD. PMH significant for polysubstance abuse. The patient states that he thinks he has chlamydia and herpes.  He states that he has itching of his groin for the past 3 weeks.  His girlfriend was diagnosed with chlamydia and herpes several weeks ago and both of them were treated however they had sex soon after and the condom broke.  His girlfriend is also here for an evaluation.  Patient denies fever, vomiting, pain, penile discharge, dysuria, testicular pain, genital sores.  HPI  Past Medical History:  Diagnosis Date  . ADHD (attention deficit hyperactivity disorder)   . Chlamydia     Patient Active Problem List   Diagnosis Date Noted  . Alcoholic intoxication with complication (HCC)   . Glasgow coma scale total score 3-8 (HCC)   . Endotracheally intubated   . Drug overdose   . Altered mental status 03/13/2016  . Family conflict   . Cocaine abuse (HCC)     History reviewed. No pertinent surgical history.      Home Medications    Prior to Admission medications   Not on File    Family History History reviewed. No pertinent family history.  Social History Social History   Tobacco Use  . Smoking status: Current Every Day Smoker    Types: Cigarettes  . Smokeless tobacco: Never Used  Substance Use Topics  . Alcohol use: Yes    Comment: occasional  . Drug use: Yes    Types: Marijuana    Comment: last use 3-4 weeks ago     Allergies   Patient has no known allergies.   Review of Systems Review of Systems  Constitutional: Negative for fever.  Genitourinary: Negative for difficulty urinating, dysuria, flank pain, genital sores, penile pain, scrotal swelling and testicular pain.  Skin:   +itching     Physical Exam Updated Vital Signs BP 126/67 (BP Location: Right Arm)   Pulse 64   Temp 98.7 F (37.1 C) (Oral)   Resp 17   Ht 6' (1.829 m)   Wt 63.5 kg   SpO2 97%   BMI 18.99 kg/m   Physical Exam Vitals signs and nursing note reviewed.  Constitutional:      General: He is not in acute distress.    Appearance: Normal appearance. He is well-developed.     Comments: Thin, calm, coooperative  HENT:     Head: Normocephalic and atraumatic.  Eyes:     General: No scleral icterus.       Right eye: No discharge.        Left eye: No discharge.     Conjunctiva/sclera: Conjunctivae normal.     Pupils: Pupils are equal, round, and reactive to light.  Neck:     Musculoskeletal: Normal range of motion.  Cardiovascular:     Rate and Rhythm: Normal rate.  Pulmonary:     Effort: Pulmonary effort is normal. No respiratory distress.  Abdominal:     General: There is no distension.  Genitourinary:    Comments: No inguinal lymphadenopathy or inguinal hernia noted. Normal circumcised penis free of lesions or rash. Testicles are nontender with normal lie. Normal scrotal appearance. No obvious discharge noted. Chaperone present during exam.  Skin:    General: Skin is warm and dry.  Neurological:     Mental Status: He is alert and oriented to person, place, and time.  Psychiatric:        Behavior: Behavior normal.      ED Treatments / Results  Labs (all labs ordered are listed, but only abnormal results are displayed) Labs Reviewed - No data to display  EKG None  Radiology No results found.  Procedures Procedures (including critical care time)  Medications Ordered in ED Medications - No data to display   Initial Impression / Assessment and Plan / ED Course  I have reviewed the triage vital signs and the nursing notes.  Pertinent labs & imaging results that were available during my care of the patient were reviewed by me and considered in my medical  decision making (see chart for details).  23 year old male presents for STD check. His vitals are normal. Exam is unremarkable. UA is normal. Will send of G&C.   Final Clinical Impressions(s) / ED Diagnoses   Final diagnoses:  Concern about STD in male without diagnosis    ED Discharge Orders    None       Bethel BornGekas, Ellanor Feuerstein Marie, PA-C 01/15/18 1506    Zadie RhineWickline, Donald, MD 01/15/18 2308

## 2018-01-14 NOTE — ED Triage Notes (Signed)
Pt reports he has had sex with his girlfriend who was dx with Chlamydia 3 weeks ago, also reports she was diagnosed (unsure when) with HSV, pt reports he has been treated "a while ago" for Chlamydia, pt reports he has had penial itching x a few weeks

## 2018-01-15 LAB — URINALYSIS, ROUTINE W REFLEX MICROSCOPIC
Bilirubin Urine: NEGATIVE
Glucose, UA: NEGATIVE mg/dL
Hgb urine dipstick: NEGATIVE
Ketones, ur: NEGATIVE mg/dL
Leukocytes, UA: NEGATIVE
Nitrite: NEGATIVE
Protein, ur: NEGATIVE mg/dL
Specific Gravity, Urine: 1.013 (ref 1.005–1.030)
pH: 6 (ref 5.0–8.0)

## 2018-01-15 NOTE — Discharge Instructions (Signed)
You will be notified if your results are abnormal however you can also check on MyChart in 2-3 days

## 2018-01-17 LAB — GC/CHLAMYDIA PROBE AMP (~~LOC~~) NOT AT ARMC
Chlamydia: NEGATIVE
Neisseria Gonorrhea: NEGATIVE

## 2018-01-19 ENCOUNTER — Telehealth (HOSPITAL_COMMUNITY): Payer: Self-pay

## 2018-02-08 IMAGING — CR DG CHEST 1V PORT
1 series · 1 of 1 positions shown · non-contrast
Comparison: None.

CLINICAL DATA: Status post intubation for drug overdose.
Unresponsive.

EXAM:
PORTABLE CHEST 1 VIEW

[portable]
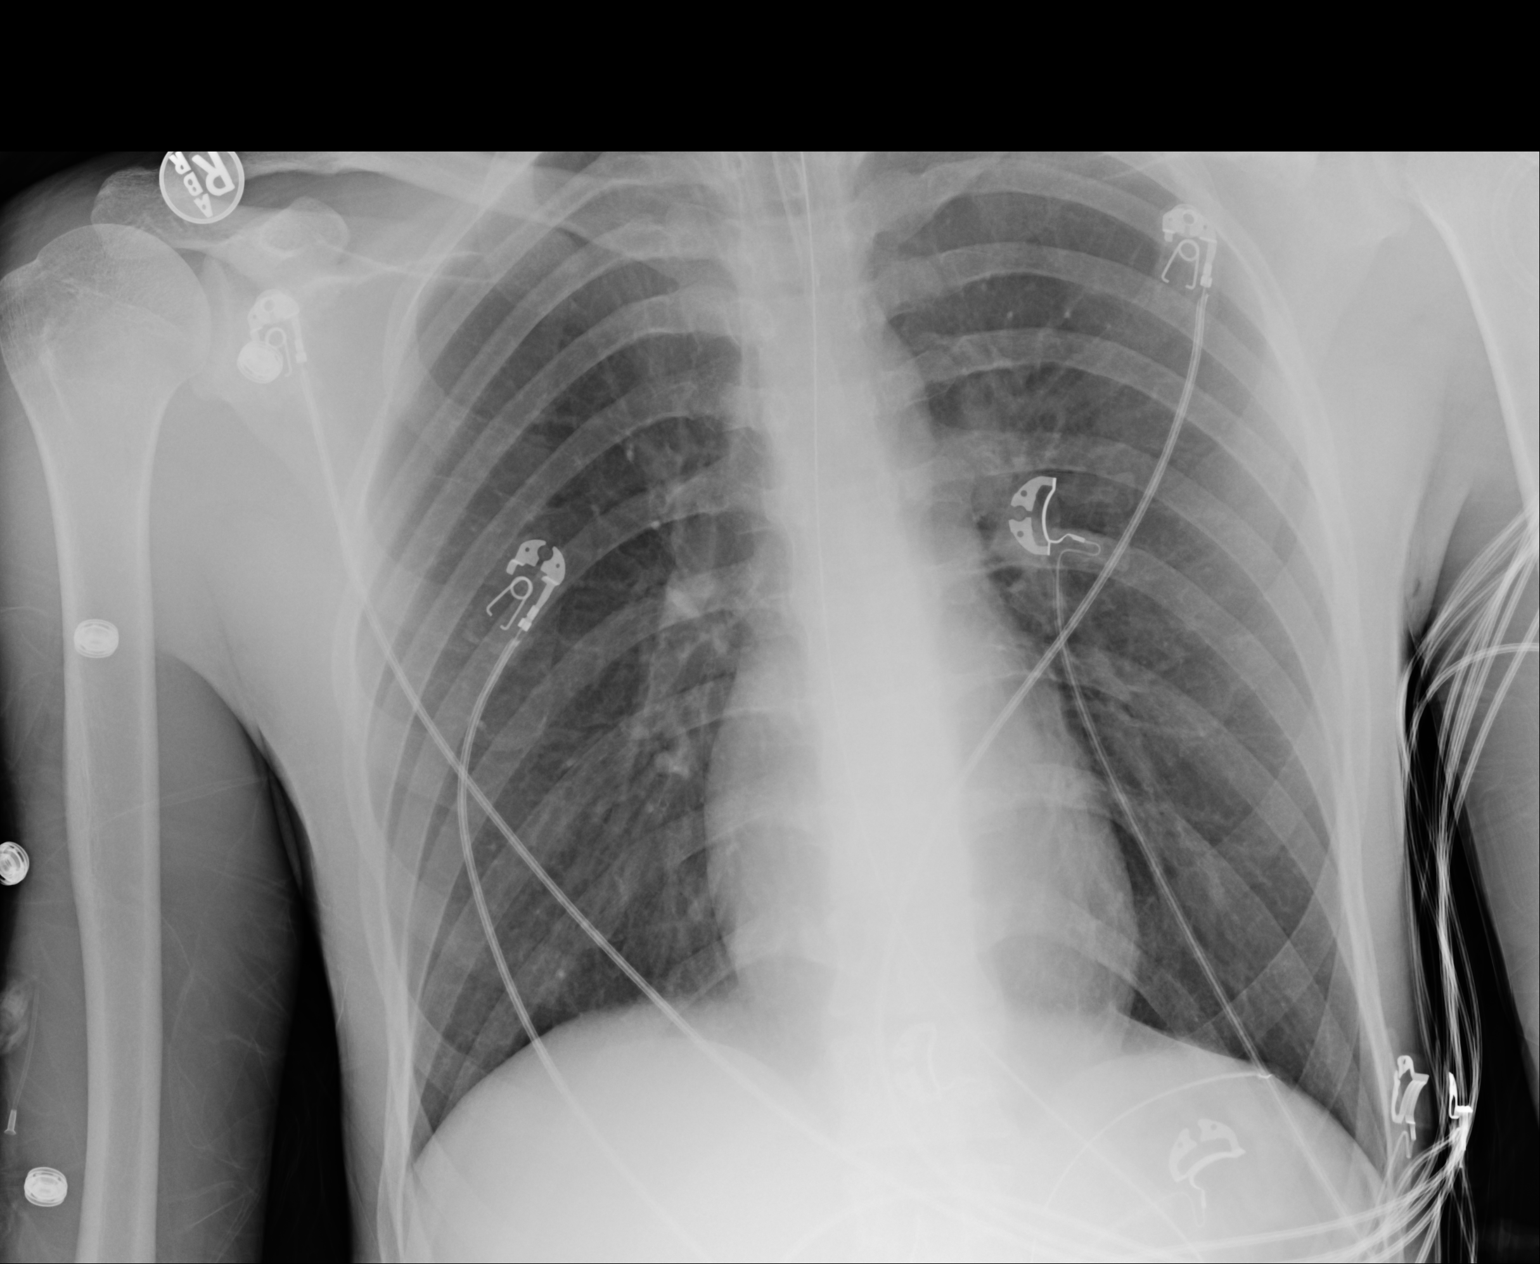

[1 of 1 positions shown; findings below may reference images not displayed]

FINDINGS: The heart size and mediastinal contours are within normal limits.
Tip of endotracheal tube is 3.9 cm above the carina in satisfactory
position. Gastric tube is seen coiled in the expected location of
the stomach. Both lungs are clear. The visualized skeletal
structures are unremarkable.
IMPRESSION: Satisfactory support line and tube positions.  Clear lungs.

## 2019-03-31 ENCOUNTER — Ambulatory Visit: Payer: Self-pay | Attending: Internal Medicine

## 2019-11-03 ENCOUNTER — Other Ambulatory Visit: Payer: Self-pay

## 2019-11-03 ENCOUNTER — Encounter (HOSPITAL_COMMUNITY): Payer: Self-pay

## 2019-11-03 ENCOUNTER — Emergency Department (HOSPITAL_COMMUNITY)
Admission: EM | Admit: 2019-11-03 | Discharge: 2019-11-03 | Disposition: A | Discharge status: Home / Self Care | Payer: Self-pay | Attending: Emergency Medicine | Admitting: Emergency Medicine

## 2019-11-03 DIAGNOSIS — F17210 Nicotine dependence, cigarettes, uncomplicated: Secondary | ICD-10-CM | POA: Insufficient documentation

## 2019-11-03 DIAGNOSIS — J029 Acute pharyngitis, unspecified: Secondary | ICD-10-CM | POA: Insufficient documentation

## 2019-11-03 LAB — MONONUCLEOSIS SCREEN: Mono Screen: NEGATIVE

## 2019-11-03 LAB — GROUP A STREP BY PCR: Group A Strep by PCR: NOT DETECTED

## 2019-11-03 NOTE — ED Triage Notes (Signed)
Pt presents to ED with complaints of sore throat x 2-3 days, denies fever.

## 2019-11-03 NOTE — Discharge Instructions (Addendum)
As discussed, your evaluation today has been largely reassuring.  But, it is important that you monitor your condition carefully, and do not hesitate to return to the ED if you develop new, or concerning changes in your condition.  A test for mononucleosis has been sent.  If this test is positive, result will be available tonight.  If so, the best therapy for this affliction is rest, stay well-hydrated, and monitoring your condition.  Otherwise, please follow-up with your physician for appropriate ongoing care.

## 2019-11-03 NOTE — ED Provider Notes (Signed)
[facility] EMERGENCY DEPARTMENT Provider Note   CSN: [id number] Arrival date & time: 11/03/19  1742     History Chief Complaint  Patient presents with  . Sore Throat    Andrew Watkins is a 24 y.o. male.  HPI  Patient presents with concern of sore throat, onset about 1 week ago.  Since that time patient has had mild sore throat, difficulty with swallowing, no fever.  No chest pain, no dyspnea. Initially patient denies taking any medication for his symptoms, it is unclear why he chose today to be evaluated. Eventually notes that he has taken 1 dose of ibuprofen, without apparent change in his condition. Patient is here, while his girlfriend is being cared for in another room with similar concerns.   Past Medical History:  Diagnosis Date  . ADHD (attention deficit hyperactivity disorder)   . Chlamydia     Patient Active Problem List   Diagnosis Date Noted  . Alcoholic intoxication with complication (HCC)   . Glasgow coma scale total score 3-8 (HCC)   . Endotracheally intubated   . Drug overdose   . Altered mental status 03/13/2016  . Family conflict   . Cocaine abuse (HCC)     History reviewed. No pertinent surgical history.     No family history on file.  Social History   Tobacco Use  . Smoking status: Current Every Day Smoker    Packs/day: 1.00    Types: Cigarettes  . Smokeless tobacco: Never Used  Vaping Use  . Vaping Use: Former  Substance Use Topics  . Alcohol use: Yes    Comment: occasional  . Drug use: Yes    Types: Marijuana    Comment: last use 3-4 weeks ago    Home Medications Prior to Admission medications   Not on File    Allergies    Patient has no known allergies.  Review of Systems   Review of Systems  Constitutional:       Per HPI, otherwise negative  HENT:       Per HPI, otherwise negative  Respiratory:       Per HPI, otherwise negative  Cardiovascular:       Per HPI, otherwise negative  Gastrointestinal: Negative  for vomiting.  Endocrine:       Negative aside from HPI  Genitourinary:       Neg aside from HPI   Musculoskeletal:       Per HPI, otherwise negative  Skin: Negative.   Allergic/Immunologic: Negative for immunocompromised state.  Neurological: Negative for syncope and weakness.    Physical Exam Updated Vital Signs BP 108/77 (BP Location: Right Arm)   Pulse 78   Temp 98.1 F (36.7 C) (Oral)   Resp 18   Ht 6' (1.829 m)   Wt 59 kg   SpO2 100%   BMI 17.63 kg/m   Physical Exam Vitals and nursing note reviewed.  Constitutional:      General: He is not in acute distress.    Comments: Thin adult male awake and alert  HENT:     Head: Normocephalic and atraumatic.     Mouth/Throat:     Dentition: Abnormal dentition. No gingival swelling or dental abscesses.     Tongue: No lesions.     Palate: No mass and lesions.     Pharynx: No pharyngeal swelling, oropharyngeal exudate, posterior oropharyngeal erythema or uvula swelling.     Tonsils: No tonsillar exudate or tonsillar abscesses.   Eyes:  Conjunctiva/sclera: Conjunctivae normal.  Cardiovascular:     Rate and Rhythm: Normal rate and regular rhythm.  Pulmonary:     Effort: Pulmonary effort is normal. No respiratory distress.     Breath sounds: No stridor.  Abdominal:     General: There is no distension.  Skin:    General: Skin is warm and dry.  Neurological:     Mental Status: He is alert and oriented to person, place, and time.     ED Results / Procedures / Treatments   Labs (all labs ordered are listed, but only abnormal results are displayed) Labs Reviewed  GROUP A STREP BY PCR  MONONUCLEOSIS SCREEN    EKG None  Radiology No results found.  Procedures Procedures (including critical care time)  Medications Ordered in ED Medications - No data to display  ED Course  I have reviewed the triage vital signs and the nursing notes.  Pertinent labs & imaging results that were available during my care of  the patient were reviewed by me and considered in my medical decision making (see chart for details).  Repeat exam the patient is awake, alert.  We discussed his strep negative result.  On here the patient is hemodynamically unremarkable, has no evidence for oropharyngeal abscess, with no increased work of breathing, no asymmetry of his pharynx. Some suspicion for viral etiology, including mononucleosis given the patient's presentation here for sore throat contemporaneously with his significant other. Patient started on appropriate OTC medication for relief, discharged in stable condition to follow-up with his mono test as an outpatient. Final Clinical Impression(s) / ED Diagnoses Final diagnoses:  Sore throat      Gerhard Munch, MD 11/03/19 2004

## 2023-02-12 ENCOUNTER — Ambulatory Visit
Admission: EM | Admit: 2023-02-12 | Discharge: 2023-02-12 | Disposition: A | Discharge status: Home / Self Care | Payer: MEDICAID | Attending: Family Medicine | Admitting: Family Medicine

## 2023-02-12 DIAGNOSIS — H66013 Acute suppurative otitis media with spontaneous rupture of ear drum, bilateral: Secondary | ICD-10-CM | POA: Diagnosis not present

## 2023-02-12 MED ORDER — AMOXICILLIN-POT CLAVULANATE 875-125 MG PO TABS: 1.0000 | ORAL_TABLET | Freq: Two times a day (BID) | ORAL | 0 refills | Status: DC

## 2023-02-12 MED ORDER — CIPROFLOXACIN-DEXAMETHASONE 0.3-0.1 % OT SUSP: 4.0000 [drp] | Freq: Two times a day (BID) | OTIC | 0 refills | Status: DC

## 2023-02-12 NOTE — ED Provider Notes (Addendum)
 [facility]    CSN: [id number] Arrival date & time: 02/12/23  0825      History   Chief Complaint Chief Complaint  Patient presents with   Otalgia    HPI Andrew Watkins is a 28 y.o. male.   Presenting today with several day history of cough, congestion, bilateral ear pain and now yellow drainage from the ears for the past 24 hours or so.  Denies loss of hearing, severe headache, nausea, vomiting, rashes.  So far trying ibuprofen with mild temporary benefit.  Per chart review history of recurrent ear infections and uncontrolled seasonal allergies.    Past Medical History:  Diagnosis Date   ADHD (attention deficit hyperactivity disorder)    Chlamydia     Patient Active Problem List   Diagnosis Date Noted   Alcoholic intoxication with complication (HCC)    Glasgow coma scale total score 3-8 (HCC)    Endotracheally intubated    Drug overdose    Altered mental status 03/13/2016   Family conflict    Cocaine abuse (HCC)     History reviewed. No pertinent surgical history.     Home Medications    Prior to Admission medications   Medication Sig Start Date End Date Taking? Authorizing Provider  amoxicillin -clavulanate (AUGMENTIN ) 875-125 MG tablet Take 1 tablet by mouth every 12 (twelve) hours. 02/12/23  Yes Stuart Vernell Norris, PA-C  ciprofloxacin -dexamethasone  (CIPRODEX ) OTIC suspension Place 4 drops into both ears 2 (two) times daily. 02/12/23  Yes Stuart Vernell Norris, PA-C    Family History History reviewed. No pertinent family history.  Social History Social History   Tobacco Use   Smoking status: Every Day    Current packs/day: 1.00    Types: Cigarettes   Smokeless tobacco: Never  Vaping Use   Vaping status: Former  Substance Use Topics   Alcohol use: Yes    Comment: occasional   Drug use: Yes    Types: Marijuana    Comment: last use 3-4 weeks ago     Allergies   Patient has no known allergies.   Review of Systems Review of  Systems Per HPI  Physical Exam Triage Vital Signs ED Triage Vitals [02/12/23 0913]  Encounter Vitals Group     BP 102/69     Systolic BP Percentile      Diastolic BP Percentile      Pulse Rate 74     Resp 16     Temp 98 F (36.7 C)     Temp Source Oral     SpO2 98 %     Weight      Height      Head Circumference      Peak Flow      Pain Score 9     Pain Loc      Pain Education      Exclude from Growth Chart    No data found.  Updated Vital Signs BP 102/69 (BP Location: Right Arm)   Pulse 74   Temp 98 F (36.7 C) (Oral)   Resp 16   SpO2 98%   Visual Acuity Right Eye Distance:   Left Eye Distance:   Bilateral Distance:    Right Eye Near:   Left Eye Near:    Bilateral Near:     Physical Exam Vitals and nursing note reviewed.  Constitutional:      Appearance: He is well-developed.  HENT:     Head: Atraumatic.     Ears:  Comments: Active yellow drainage from bilateral ears, bilateral TMs ruptured    Nose: Rhinorrhea present.     Mouth/Throat:     Mouth: Mucous membranes are moist.     Pharynx: No oropharyngeal exudate.  Eyes:     Conjunctiva/sclera: Conjunctivae normal.     Pupils: Pupils are equal, round, and reactive to light.  Cardiovascular:     Rate and Rhythm: Normal rate and regular rhythm.  Pulmonary:     Effort: Pulmonary effort is normal. No respiratory distress.     Breath sounds: No wheezing or rales.  Musculoskeletal:        General: Normal range of motion.     Cervical back: Normal range of motion and neck supple.  Lymphadenopathy:     Cervical: No cervical adenopathy.  Skin:    General: Skin is warm and dry.  Neurological:     Mental Status: He is alert and oriented to person, place, and time.  Psychiatric:        Behavior: Behavior normal.      UC Treatments / Results  Labs (all labs ordered are listed, but only abnormal results are displayed) Labs Reviewed - No data to display  EKG   Radiology No results  found.  Procedures Procedures (including critical care time)  Medications Ordered in UC Medications - No data to display  Initial Impression / Assessment and Plan / UC Course  I have reviewed the triage vital signs and the nursing notes.  Pertinent labs & imaging results that were available during my care of the patient were reviewed by me and considered in my medical decision making (see chart for details).     Treat with Augmentin , Ciprodex  drops, PCP follow-up in the next few weeks for recheck.  Return for worsening symptoms.  Work note given. Final Clinical Impressions(s) / UC Diagnoses   Final diagnoses:  Non-recurrent acute suppurative otitis media of both ears with spontaneous rupture of tympanic membranes   Discharge Instructions   None    ED Prescriptions     Medication Sig Dispense Auth. Provider   ciprofloxacin -dexamethasone  (CIPRODEX ) OTIC suspension Place 4 drops into both ears 2 (two) times daily. 7.5 mL Stuart Vernell Norris, PA-C   amoxicillin -clavulanate (AUGMENTIN ) 875-125 MG tablet Take 1 tablet by mouth every 12 (twelve) hours. 14 tablet Stuart Vernell Norris, [place]      PDMP not reviewed this encounter.   Stuart Vernell Norris, PA-C 02/12/23 0940    Stuart Vernell Norris, PA-C 02/12/23 [phone]

## 2023-02-12 NOTE — ED Triage Notes (Signed)
 Pt states bilateral ear pain and drainage for the past 2 days.

## 2023-08-04 ENCOUNTER — Ambulatory Visit: Payer: Self-pay

## 2023-08-04 NOTE — Telephone Encounter (Signed)
 FYI Only or Action Required?: FYI only for provider.  Patient was last seen in primary care on new patient, N/A.Andrew Watkins  Called Nurse Triage reporting Chest Pain.  Symptoms began a week ago.  Interventions attempted: Nothing.  Symptoms [id number] left sided rib/chest pains (lasts a second) stable.  Triage Disposition: See PCP When Office is Open (Within 3 Days)- advised to go to urgent care or ED, new patient appointment made   Patient/caregiver understands and will follow disposition?: Yes                Copied from CRM [id number]. Topic: Clinical - Red Word Triage >> Aug 04, 2023 12:41 PM Carla L wrote: Red Word that prompted transfer to Nurse Triage: pains in chest, comes and goes Reason for Disposition  [1] Chest pain(s) lasting a few seconds AND [2] persists > 3 days  Answer Assessment - Initial Assessment Questions 1. LOCATION: Where does it hurt?       Left side of chest, near my ribs on the left  2. RADIATION: Does the pain go anywhere else? (e.g., into neck, jaw, arms, back)     Not really  3. ONSET: When did the chest pain begin? (Minutes, hours or days)      X 1 week.  4. PATTERN: Does the pain come and go, or has it been constant since it started?  Does it get worse with exertion?      Comes and goes, not every day. He states he feels it at rest, and it goes away when he exerts himself or walks around.  5. DURATION: How long does it last (e.g., seconds, minutes, hours)     Sudden pain, it lasts for a second.  6. SEVERITY: How bad is the pain?  (e.g., Scale 1-10; mild, moderate, or severe)     No pain at this time.  7. CARDIAC RISK FACTORS: Do you have any history of heart problems or risk factors for heart disease? (e.g., angina, prior heart attack; diabetes, high blood pressure, high cholesterol, smoker, or strong family history of heart disease)     He states when he was a child he was on a medication my heart beat too  fast  8. PULMONARY RISK FACTORS: Do you have any history of lung disease?  (e.g., blood clots in lung, asthma, emphysema, birth control pills)     No.  9. CAUSE: What do you think is causing the chest pain?     He states he is unsure if it is indigestion, denies any injury to chest.  10. OTHER SYMPTOMS: Do you have any other symptoms? (e.g., dizziness, nausea, vomiting, sweating, fever, difficulty breathing, cough)       Patient denies rib swelling, nausea, vomiting, SOB, dizziness, cough, fever, sweating.  11. PREGNANCY: Is there any chance you are pregnant? When was your last menstrual period?       N/A.  Protocols used: Chest Pain-A-AH

## 2023-08-29 ENCOUNTER — Ambulatory Visit
Admission: EM | Admit: 2023-08-29 | Discharge: 2023-08-29 | Disposition: A | Discharge status: Home / Self Care | Payer: MEDICAID | Attending: Family Medicine | Admitting: Family Medicine

## 2023-08-29 DIAGNOSIS — K130 Diseases of lips: Secondary | ICD-10-CM | POA: Insufficient documentation

## 2023-08-29 MED ORDER — MUPIROCIN 2 % EX OINT: 1.0000 | TOPICAL_OINTMENT | Freq: Two times a day (BID) | CUTANEOUS | 0 refills | Status: DC

## 2023-08-29 NOTE — ED Provider Notes (Signed)
 [facility]    CSN: [id number] Arrival date & time: 08/29/23  1419      History   Chief Complaint No chief complaint on file.   HPI Andrew Watkins is a 28 y.o. male.   Patient presenting today with several day history of lesion to the right corner of mouth.  States it is irritated but not significantly painful or itchy.  Denies new foods, products, medications or any other changes.  Has been trying Chapstick with minimal relief.  Significant other has a lesion in the same spot.    Past Medical History:  Diagnosis Date   ADHD (attention deficit hyperactivity disorder)    Chlamydia     Patient Active Problem List   Diagnosis Date Noted   Alcoholic intoxication with complication (HCC)    Glasgow coma scale total score 3-8 (HCC)    Endotracheally intubated    Drug overdose    Altered mental status 03/13/2016   Family conflict    Cocaine abuse (HCC)     History reviewed. No pertinent surgical history.     Home Medications    Prior to Admission medications   Medication Sig Start Date End Date Taking? Authorizing Provider  mupirocin  ointment (BACTROBAN ) 2 % Apply 1 Application topically 2 (two) times daily. 08/29/23  Yes Stuart Vernell Norris, PA-C  amoxicillin -clavulanate (AUGMENTIN ) 875-125 MG tablet Take 1 tablet by mouth every 12 (twelve) hours. 02/12/23   Stuart Vernell Norris, PA-C  ciprofloxacin -dexamethasone  (CIPRODEX ) OTIC suspension Place 4 drops into both ears 2 (two) times daily. 02/12/23   Stuart Vernell Norris, PA-C    Family History History reviewed. No pertinent family history.  Social History Social History   Tobacco Use   Smoking status: Every Day    Current packs/day: 1.00    Types: Cigarettes   Smokeless tobacco: Never  Vaping Use   Vaping status: Former  Substance Use Topics   Alcohol use: Yes    Comment: occasional   Drug use: Yes    Types: Marijuana    Comment: last use 3-4 weeks ago     Allergies   Patient has no  known allergies.   Review of Systems Review of Systems Per HPI  Physical Exam Triage Vital Signs ED Triage Vitals  Encounter Vitals Group     BP 08/29/23 1525 129/85     Girls Systolic BP Percentile --      Girls Diastolic BP Percentile --      Boys Systolic BP Percentile --      Boys Diastolic BP Percentile --      Pulse Rate 08/29/23 1525 72     Resp 08/29/23 1525 16     Temp 08/29/23 1525 98.5 F (36.9 C)     Temp Source 08/29/23 1525 Oral     SpO2 08/29/23 1525 97 %     Weight --      Height --      Head Circumference --      Peak Flow --      Pain Score 08/29/23 1528 0     Pain Loc --      Pain Education --      Exclude from Growth Chart --    No data found.  Updated Vital Signs BP 129/85 (BP Location: Right Arm)   Pulse 72   Temp 98.5 F (36.9 C) (Oral)   Resp 16   SpO2 97%   Visual Acuity Right Eye Distance:   Left Eye Distance:  Bilateral Distance:    Right Eye Near:   Left Eye Near:    Bilateral Near:     Physical Exam Vitals and nursing note reviewed.  Constitutional:      Appearance: Normal appearance.  HENT:     Head: Atraumatic.     Mouth/Throat:     Comments: Small erythematous scabbed lesion to the corner of right side of mouth Eyes:     Extraocular Movements: Extraocular movements intact.     Conjunctiva/sclera: Conjunctivae normal.  Cardiovascular:     Rate and Rhythm: Normal rate.  Pulmonary:     Effort: Pulmonary effort is normal.  Musculoskeletal:        General: Normal range of motion.     Cervical back: Normal range of motion and neck supple.  Skin:    General: Skin is warm and dry.  Neurological:     Mental Status: He is oriented to person, place, and time.  Psychiatric:        Mood and Affect: Mood normal.        Thought Content: Thought content normal.        Judgment: Judgment normal.      UC Treatments / Results  Labs (all labs ordered are listed, but only abnormal results are displayed) Labs Reviewed  HSV  1/2 PCR (SURFACE)    EKG   Radiology No results found.  Procedures Procedures (including critical care time)  Medications Ordered in UC Medications - No data to display  Initial Impression / Assessment and Plan / UC Course  I have reviewed the triage vital signs and the nursing notes.  Pertinent labs & imaging results that were available during my care of the patient were reviewed by me and considered in my medical decision making (see chart for details).     Will rule out HSV but more consistent with angular cheilitis.  Treat with mupirocin , Vaseline or Aquaphor and avoid any scented or irritating products.  Return for worsening symptoms.  Final Clinical Impressions(s) / UC Diagnoses   Final diagnoses:  Lip lesion  Angular cheilitis     Discharge Instructions      Apply the antibiotic ointment 2 times daily at least to the area, may apply Vaseline or Aquaphor in between applications of this medication to help protect the irritated area.  Avoid any scented Chapstick's or facial products as this can worsen the irritation.  We will let you know if the HSV test comes back positive.    ED Prescriptions     Medication Sig Dispense Auth. Provider   mupirocin  ointment (BACTROBAN ) 2 % Apply 1 Application topically 2 (two) times daily. 60 g Stuart Vernell Norris, [place]      PDMP not reviewed this encounter.   Stuart Vernell Norris, [place] 08/29/23 1605

## 2023-08-29 NOTE — ED Triage Notes (Signed)
 Pt reports bumps on the both sides of the mouth x 1 week. Has been using lip balm to help but has fond no relief, states partner has the same thing.

## 2023-08-29 NOTE — Discharge Instructions (Signed)
 Apply the antibiotic ointment 2 times daily at least to the area, may apply Vaseline or Aquaphor in between applications of this medication to help protect the irritated area.  Avoid any scented Chapstick's or facial products as this can worsen the irritation.  We will let you know if the HSV test comes back positive.

## 2023-08-30 LAB — HSV 1/2 PCR (SURFACE)
HSV-1 DNA: NOT DETECTED
HSV-2 DNA: NOT DETECTED

## 2023-11-01 ENCOUNTER — Ambulatory Visit: Payer: MEDICAID | Admitting: Physician Assistant

## 2023-11-01 ENCOUNTER — Encounter: Payer: Self-pay | Admitting: Physician Assistant

## 2023-11-01 VITALS — BP 102/68 | HR 80 | Ht 72.0 in | Wt 128.5 lb

## 2023-11-01 DIAGNOSIS — J069 Acute upper respiratory infection, unspecified: Secondary | ICD-10-CM | POA: Insufficient documentation

## 2023-11-01 DIAGNOSIS — Z7689 Persons encountering health services in other specified circumstances: Secondary | ICD-10-CM

## 2023-11-01 NOTE — Progress Notes (Signed)
 New Patient Office Visit  Subjective    Patient ID: Andrew Watkins, male    DOB: January 21, 1995  Age: 28 y.o. MRN: [id number]  CC:  Chief Complaint  Patient presents with   New Patient (Initial Visit)    Pt. Is a new patient. Pt. Mentioned to having chest pain but stated they have not been as bad. Pt. Mentioned having a cough since this morning but mentioned kids are sick    HPI Andrew Watkins presents to establish care  Discussed the use of AI scribe software for clinical note transcription with the patient, who gave verbal consent to proceed.  History of Present Illness Andrew Watkins is a 28 year old male who presents with cough and a sore throat.  He developed a sore throat and cough last night, possibly related to his daughter's recent ear infection. There is no fever, difficulty breathing, or shortness of breath. He cannot check his temperature due to a dead thermometer battery. He admits to eating and drinking per usual.   He smokes about a pack of cigarettes daily since age 21, drinks alcohol occasionally, and occasionally vapes. His father has asthma. He denies applicable past medical history. He does not have additional concerns or complaints today.    Outpatient Encounter Medications as of 11/01/2023  Medication Sig   [DISCONTINUED] amoxicillin -clavulanate (AUGMENTIN ) 875-125 MG tablet Take 1 tablet by mouth every 12 (twelve) hours.   [DISCONTINUED] ciprofloxacin -dexamethasone  (CIPRODEX ) OTIC suspension Place 4 drops into both ears 2 (two) times daily.   [DISCONTINUED] mupirocin  ointment (BACTROBAN ) 2 % Apply 1 Application topically 2 (two) times daily.   No facility-administered encounter medications on file as of 11/01/2023.    Past Medical History:  Diagnosis Date   ADHD (attention deficit hyperactivity disorder)    Chlamydia     History reviewed. No pertinent surgical history.  Family History  Problem Relation Age of Onset   Asthma Father      Social History   Socioeconomic History   Marital status: Single    Spouse name: Not on file   Number of children: Not on file   Years of education: Not on file   Highest education level: Not on file  Occupational History   Not on file  Tobacco Use   Smoking status: Every Day    Current packs/day: 1.00    Average packs/day: 1 pack/day for 21.8 years (21.8 ttl pk-yrs)    Types: Cigarettes    Start date: 2004   Smokeless tobacco: Never  Vaping Use   Vaping status: Some Days  Substance and Sexual Activity   Alcohol use: Yes    Comment: occasional   Drug use: Not Currently   Sexual activity: Not on file  Other Topics Concern   Not on file  Social History Narrative   Not on file   Social Drivers of Health   Financial Resource Strain: Not on file  Food Insecurity: Not on file  Transportation Needs: Not on file  Physical Activity: Not on file  Stress: Not on file  Social Connections: Not on file  Intimate Partner Violence: Not on file    Review of Systems  Constitutional:  Negative for chills, fever and malaise/fatigue.  HENT:  Positive for sore throat. Negative for congestion and ear pain.   Eyes:  Negative for blurred vision and double vision.  Respiratory:  Positive for cough. Negative for shortness of breath.   Cardiovascular:  Negative for chest pain and palpitations.  Musculoskeletal:  Negative for joint pain and myalgias.  Neurological:  Negative for dizziness and headaches.        Objective    BP 102/68 (BP Location: Right Arm, Patient Position: Sitting)   Pulse 80   Ht 6' (1.829 m)   Wt 128 lb 8 oz (58.3 kg)   SpO2 100%   BMI 17.43 kg/m   Physical Exam Vitals reviewed.  Constitutional:      General: He is not in acute distress.    Appearance: Normal appearance. He is normal weight. He is not ill-appearing.  HENT:     Nose: Nose normal.     Mouth/Throat:     Mouth: Mucous membranes are moist.     Pharynx: Oropharynx is clear. No posterior  oropharyngeal erythema.  Eyes:     Extraocular Movements: Extraocular movements intact.     Conjunctiva/sclera: Conjunctivae normal.  Cardiovascular:     Rate and Rhythm: Normal rate and regular rhythm.     Heart sounds: No murmur heard. Pulmonary:     Effort: Pulmonary effort is normal.     Breath sounds: Normal breath sounds. No wheezing, rhonchi or rales.  Skin:    General: Skin is warm and dry.     Capillary Refill: Capillary refill takes less than 2 seconds.  Neurological:     General: No focal deficit present.     Mental Status: He is alert and oriented to person, place, and time.  Psychiatric:        Mood and Affect: Mood normal.        Behavior: Behavior normal.       Assessment & Plan:  Encounter to establish care  Viral URI Assessment & Plan: Patient appears stable today. Benign exam. Likely self-resolving viral infection. Supportive care reviewed with patient. Discussed with patient that there are no indications for antibiotics at this time, and viral respiratory illness can be persistent in duration.Tylenol  or ibuprofen for pain or fever as needed. I advised Flonase for nasal congestion and ear pressure. May continue with OTC cold medications. Patient instructed to return to clinic if worsening shortness of breath, chest pain, hypoxia, or other concerns. Patient agreeable to plan.      Return in about 3 months (around 02/01/2024).   Charmaine Darnella Zeiter, PA-C

## 2023-11-01 NOTE — Assessment & Plan Note (Signed)
 Patient appears stable today. Benign exam. Likely self-resolving viral infection. Supportive care reviewed with patient. Discussed with patient that there are no indications for antibiotics at this time, and viral respiratory illness can be persistent in duration.Tylenol or ibuprofen for pain or fever as needed. I advised Flonase for nasal congestion and ear pressure. May continue with OTC cold medications. Patient instructed to return to clinic if worsening shortness of breath, chest pain, hypoxia, or other concerns. Patient agreeable to plan.

## 2024-02-01 ENCOUNTER — Ambulatory Visit (INDEPENDENT_AMBULATORY_CARE_PROVIDER_SITE_OTHER): Payer: MEDICAID | Admitting: Physician Assistant

## 2024-02-01 ENCOUNTER — Encounter: Payer: MEDICAID | Admitting: Physician Assistant

## 2024-02-01 ENCOUNTER — Encounter: Payer: Self-pay | Admitting: Physician Assistant

## 2024-02-01 VITALS — BP 120/82 | HR 64 | Temp 98.2°F | Ht 72.0 in | Wt 138.5 lb

## 2024-02-01 DIAGNOSIS — Z0000 Encounter for general adult medical examination without abnormal findings: Secondary | ICD-10-CM | POA: Diagnosis not present

## 2024-02-01 NOTE — Progress Notes (Signed)
 "  Complete physical exam  Patient: Andrew Watkins   DOB: 1995-11-13   29 y.o. Male  MRN: [id number]  Subjective:    Chief Complaint  Patient presents with   Annual Exam    Patient is here for a physical   Patient would like his ears looked at. States he feels like he is having an ear infection     Andrew Watkins is a 29 y.o. male who presents today for a complete physical exam. He reports consuming a general diet. The patient does not participate in regular exercise at present. He generally feels fairly well. He reports sleeping fairly well. He does not have additional problems to discuss today.    Most recent fall risk assessment:    02/01/2024    4:16 PM  Fall Risk   Falls in the past year? 0  Number falls in past yr: 0  Injury with Fall? 0  Follow up Falls evaluation completed     Most recent depression screenings:    02/01/2024    4:16 PM 11/01/2023   10:31 AM  PHQ 2/9 Scores  PHQ - 2 Score 0 0  PHQ- 9 Score 0 0      Data saved with a previous flowsheet row definition    Vision:Not within last year  and Dental: No current dental problems and No regular dental care   Patient Care Team: Asherah Lavoy, Charmaine, [place] as PCP - General (Physician Assistant)   Show/hide medication list[1]  Review of Systems  Constitutional:  Negative for activity change, appetite change, fatigue and fever.  Eyes:  Negative for visual disturbance.  Respiratory:  Negative for cough and shortness of breath.   Cardiovascular:  Negative for chest pain.  Gastrointestinal:  Negative for constipation, diarrhea and vomiting.  Neurological:  Negative for light-headedness and headaches.        Objective:     BP 120/82 (BP Location: Left Arm, Patient Position: Sitting)   Pulse 64   Temp 98.2 F (36.8 C)   Ht 6' (1.829 m)   Wt 138 lb 8 oz (62.8 kg)   SpO2 94%   BMI 18.78 kg/m   Physical Exam Constitutional:      General: He is not in acute distress.    Appearance: Normal  appearance. He is normal weight. He is not ill-appearing.  HENT:     Head: Normocephalic and atraumatic.     Ears:     Comments: Cerumen present in bilateral canals    Mouth/Throat:     Mouth: Mucous membranes are moist.     Dentition: Abnormal dentition (no teeth).     Pharynx: Oropharynx is clear. No posterior oropharyngeal erythema.  Eyes:     Extraocular Movements: Extraocular movements intact.     Conjunctiva/sclera: Conjunctivae normal.  Cardiovascular:     Rate and Rhythm: Normal rate and regular rhythm.     Heart sounds: No murmur heard. Pulmonary:     Effort: Pulmonary effort is normal.  Skin:    General: Skin is warm and dry.  Neurological:     General: No focal deficit present.     Mental Status: He is alert and oriented to person, place, and time.  Psychiatric:        Mood and Affect: Mood normal.        Behavior: Behavior normal.      No results found for any visits on 02/01/24.     Assessment & Plan:    Routine Health Maintenance  and Physical Exam  Health Maintenance  Topic Date Due   Hepatitis C Screening  Never done   DTaP/Tdap/Td vaccine (1 - Tdap) Never done   Pneumococcal Vaccine (1 of 2 - PCV) Never done   Hepatitis B Vaccine (1 of 3 - 19+ 3-dose series) Never done   COVID-19 Vaccine (1 - 2025-26 season) Never done   Flu Shot  04/04/2024*   HPV Vaccine (No Doses Required) Completed   HIV Screening  Completed   Meningitis B Vaccine  Aged Out  *Topic was postponed. The date shown is not the original due date.    Discussed health benefits of physical activity, and encouraged him to engage in regular exercise appropriate for his age and condition.  Problem List Items Addressed This Visit   None Visit Diagnoses       Annual visit for general adult medical examination without abnormal findings    -  Primary      Return in about 1 year (around 01/31/2025) for phys .  Safety measures discussed. Immunizations reviewed: declined vaccines Diet  and exercise/ lifestyle modifications discussed: Recommend 150 minutes per week of exercise such as walking. Recommend lots of fresh produce to include fruits, vegetables, beans, healthy fats such as avocado, nuts, seeds, and 3-6 ounces of protein at each meal.  Avoid fried foods and fast food. Limit alcohol consumption: no more than one drink per day for women and 2 drinks per day for men.  Stress management discussed. Routine vision and dental screening discussed: recommend dentist every 6 months, gets vision checked every 1-2 years.  Health maintenance: up to date Questions answered.       Jye Fariss, PA-C     [1]  No outpatient medications prior to visit.   No facility-administered medications prior to visit.   "
